# Patient Record
Sex: Male | Born: 1975 | Race: White | Hispanic: No | Marital: Married
Health system: Midwestern US, Academic
[De-identification: ages and names within clinical notes are randomized; demographics above are authoritative.]

---

## 2017-03-29 ENCOUNTER — Encounter: Admit: 2017-03-29 | Discharge: 2017-03-30 | Primary: Family

## 2017-06-06 ENCOUNTER — Encounter: Admit: 2017-06-06 | Discharge: 2017-06-06 | Payer: BC Managed Care – PPO | Primary: Family

## 2017-06-06 ENCOUNTER — Encounter: Admit: 2017-06-06 | Discharge: 2017-06-07 | Payer: BC Managed Care – PPO | Primary: Family

## 2017-06-06 ENCOUNTER — Inpatient Hospital Stay
Admit: 2017-06-06 | Discharge: 2017-06-14 | Disposition: A | Discharge status: Other Acute Inpatient Hospital | Payer: BC Managed Care – PPO

## 2017-06-06 DIAGNOSIS — S11021A Laceration without foreign body of trachea, initial encounter: Principal | ICD-10-CM

## 2017-06-06 DIAGNOSIS — I10 Essential (primary) hypertension: ICD-10-CM

## 2017-06-06 DIAGNOSIS — F329 Major depressive disorder, single episode, unspecified: ICD-10-CM

## 2017-06-06 DIAGNOSIS — C799 Secondary malignant neoplasm of unspecified site: Principal | ICD-10-CM

## 2017-06-06 LAB — LACTIC ACID (BG - RAPID LACTATE): Lab: 1.4 MMOL/L (ref 0.5–2.0)

## 2017-06-06 LAB — ALCOHOL LEVEL: Lab: <10 mg/dL (ref 98–110)

## 2017-06-06 LAB — BASIC METABOLIC PANEL
Lab: 10 % (ref 3–12)
Lab: 136 MMOL/L — ABNORMAL LOW (ref 137–147)
Lab: 21 MMOL/L (ref 21–30)

## 2017-06-06 LAB — CBC: Lab: 10 10*3/uL (ref 4.5–11.0)

## 2017-06-06 LAB — TEG WITH KAOLIN
Lab: 3.1 min (ref <9.1)
Lab: 4.3 min (ref <12.1)
Lab: 64 mm (ref >49.9)

## 2017-06-06 LAB — PROTIME INR (PT): Lab: 1 M/UL (ref 0.8–1.2)

## 2017-06-06 LAB — PTT (APTT): Lab: 22 s — ABNORMAL LOW (ref 24.0–36.5)

## 2017-06-06 LAB — BETA-HCG: Lab: <1 U/L (ref <5)

## 2017-06-06 MED ORDER — FENTANYL CITRATE (PF) 50 MCG/ML IJ SOLN
0 refills | Status: DC
Start: 2017-06-06 — End: 2017-06-06
  Administered 2017-06-06: 19:00:00 100 ug via INTRAVENOUS
  Administered 2017-06-06 (×2): 50 ug via INTRAVENOUS
  Administered 2017-06-06: 18:00:00 100 ug via INTRAVENOUS

## 2017-06-06 MED ORDER — SODIUM CHLORIDE 0.9 % IR SOLN
0 refills | Status: DC
Start: 2017-06-06 — End: 2017-06-07
  Administered 2017-06-06: 18:00:00 1000 mL

## 2017-06-06 MED ORDER — FENTANYL CITRATE (PF) 50 MCG/ML IJ SOLN
25-50 ug | INTRAVENOUS | 0 refills | Status: DC | PRN
Start: 2017-06-06 — End: 2017-06-06

## 2017-06-06 MED ORDER — AMPICILLIN/SULBACTAM 3G/100ML NS IVPB (MB+)
3 g | Freq: Once | INTRAVENOUS | 0 refills | Status: CP
Start: 2017-06-06 — End: ?
  Administered 2017-06-06 (×2): 3 g via INTRAVENOUS

## 2017-06-06 MED ORDER — PROMETHAZINE 25 MG/ML IJ SOLN
6.25 mg | INTRAVENOUS | 0 refills | Status: DC | PRN
Start: 2017-06-06 — End: 2017-06-06

## 2017-06-06 MED ORDER — HALOPERIDOL LACTATE 5 MG/ML IJ SOLN
1 mg | Freq: Once | INTRAVENOUS | 0 refills | Status: DC | PRN
Start: 2017-06-06 — End: 2017-06-06

## 2017-06-06 MED ORDER — PHENYLEPHRINE IN 0.9% NACL(PF) 1 MG/10 ML (100 MCG/ML) IV SYRG
INTRAVENOUS | 0 refills | Status: DC
Start: 2017-06-06 — End: 2017-06-06
  Administered 2017-06-06: 20:00:00 200 ug via INTRAVENOUS

## 2017-06-06 MED ORDER — HYDROMORPHONE (PF) 2 MG/ML IJ SYRG
.5-1 mg | INTRAVENOUS | 0 refills | Status: DC | PRN
Start: 2017-06-06 — End: 2017-06-06

## 2017-06-06 MED ORDER — DEXMEDETOMIDINE# 4MCG/ML IV SOLN
0 refills | Status: DC
Start: 2017-06-06 — End: 2017-06-06
  Administered 2017-06-06 (×2): 10 ug via INTRAVENOUS
  Administered 2017-06-06: 20:00:00 20 ug via INTRAVENOUS
  Administered 2017-06-06: 20:00:00 10 ug via INTRAVENOUS

## 2017-06-06 MED ORDER — SODIUM CHLORIDE 0.9 % IV SOLP
0 refills | Status: DC
Start: 2017-06-06 — End: 2017-06-06
  Administered 2017-06-06: 18:00:00 via INTRAVENOUS

## 2017-06-06 MED ORDER — FENTANYL CITRATE (PF) 50 MCG/ML IJ SOLN
50 ug | Freq: Once | INTRAVENOUS | 0 refills | Status: CP
Start: 2017-06-06 — End: ?

## 2017-06-06 MED ORDER — LACTATED RINGERS IV SOLP
INTRAVENOUS | 0 refills | Status: DC
Start: 2017-06-06 — End: 2017-06-07
  Administered 2017-06-06 – 2017-06-07 (×2): 1000.000 mL via INTRAVENOUS

## 2017-06-06 MED ORDER — PROPOFOL INJ 10 MG/ML IV VIAL
0 refills | Status: DC
Start: 2017-06-06 — End: 2017-06-06
  Administered 2017-06-06: 18:00:00 50 mg via INTRAVENOUS

## 2017-06-06 MED ORDER — KETAMINE 10 MG/ML IJ SOLN
0 refills | Status: DC
Start: 2017-06-06 — End: 2017-06-06
  Administered 2017-06-06: 18:00:00 60 mg via INTRAVENOUS

## 2017-06-06 MED ORDER — ACETAMINOPHEN 1,000 MG/100 ML (10 MG/ML) IV SOLN
1000 mg | Freq: Once | INTRAVENOUS | 0 refills | Status: CP
Start: 2017-06-06 — End: ?
  Administered 2017-06-07: 01:00:00 1000 mg via INTRAVENOUS

## 2017-06-06 MED ORDER — CEFAZOLIN 1 GRAM IJ SOLR
0 refills | Status: DC
Start: 2017-06-06 — End: 2017-06-06
  Administered 2017-06-06: 18:00:00 2 g via INTRAVENOUS

## 2017-06-06 MED ORDER — BACITRACIN ZINC 500 UNIT/GRAM TP OINT
Freq: Two times a day (BID) | TOPICAL | 0 refills | Status: DC
Start: 2017-06-06 — End: 2017-06-14
  Administered 2017-06-07: 03:00:00 via TOPICAL

## 2017-06-06 MED ORDER — ENOXAPARIN 40 MG/0.4 ML SC SYRG
40 mg | Freq: Every day | SUBCUTANEOUS | 0 refills | Status: CP
Start: 2017-06-06 — End: ?
  Administered 2017-06-07 – 2017-06-11 (×5): 40 mg via SUBCUTANEOUS

## 2017-06-06 MED ORDER — LIDOCAINE (PF) 200 MG/10 ML (2 %) IJ SYRG
0 refills | Status: DC
Start: 2017-06-06 — End: 2017-06-06
  Administered 2017-06-06: 20:00:00 80 mg via INTRAVENOUS

## 2017-06-06 MED ORDER — DIPHTH,PERTUS(ACELL),TETANUS 2.5-8-5 LF-MCG-LF/0.5ML IM SUSP
.5 mL | Freq: Once | INTRAMUSCULAR | 0 refills | Status: DC
Start: 2017-06-06 — End: 2017-06-14

## 2017-06-06 MED ORDER — ONDANSETRON HCL (PF) 4 MG/2 ML IJ SOLN
INTRAVENOUS | 0 refills | Status: DC
Start: 2017-06-06 — End: 2017-06-06
  Administered 2017-06-06: 20:00:00 4 mg via INTRAVENOUS

## 2017-06-06 MED ORDER — DIPHENHYDRAMINE HCL 50 MG/ML IJ SOLN
25 mg | Freq: Once | INTRAVENOUS | 0 refills | Status: DC | PRN
Start: 2017-06-06 — End: 2017-06-06

## 2017-06-06 MED ORDER — DEXTRAN 70-HYPROMELLOSE (PF) 0.1-0.3 % OP DPET
0 refills | Status: DC
Start: 2017-06-06 — End: 2017-06-06
  Administered 2017-06-06: 18:00:00 2 [drp] via OPHTHALMIC

## 2017-06-06 MED ORDER — OXYCODONE 5 MG PO TAB
5-10 mg | Freq: Once | ORAL | 0 refills | Status: DC | PRN
Start: 2017-06-06 — End: 2017-06-06

## 2017-06-06 MED ORDER — ELECTROLYTE-A IV SOLP
0 refills | Status: DC
Start: 2017-06-06 — End: 2017-06-06
  Administered 2017-06-06: 19:00:00 via INTRAVENOUS

## 2017-06-06 MED ORDER — LACTATED RINGERS IV SOLP
INTRAVENOUS | 0 refills | Status: DC
Start: 2017-06-06 — End: 2017-06-06

## 2017-06-06 MED ORDER — SUGAMMADEX 100 MG/ML IV SOLN
INTRAVENOUS | 0 refills | Status: DC
Start: 2017-06-06 — End: 2017-06-06
  Administered 2017-06-06: 20:00:00 200 mg via INTRAVENOUS

## 2017-06-06 MED ORDER — FENTANYL CITRATE (PF) 50 MCG/ML IJ SOLN
25-50 ug | INTRAVENOUS | 0 refills | Status: DC | PRN
Start: 2017-06-06 — End: 2017-06-13
  Administered 2017-06-06: 22:00:00 50 ug via INTRAVENOUS
  Administered 2017-06-06: 21:00:00 25 ug via INTRAVENOUS
  Administered 2017-06-07 – 2017-06-08 (×13): 50 ug via INTRAVENOUS

## 2017-06-06 MED ORDER — ROCURONIUM 10 MG/ML IV SOLN
INTRAVENOUS | 0 refills | Status: DC
Start: 2017-06-06 — End: 2017-06-06
  Administered 2017-06-06: 18:00:00 50 mg via INTRAVENOUS
  Administered 2017-06-06: 19:00:00 10 mg via INTRAVENOUS

## 2017-06-06 MED ORDER — CEFAZOLIN INJ 1GM IVP
1 g | INTRAVENOUS | 0 refills | Status: CP
Start: 2017-06-06 — End: ?
  Administered 2017-06-07 (×3): 1 g via INTRAVENOUS

## 2017-06-06 MED ORDER — FLUOXETINE 20 MG PO CAP
60 mg | Freq: Every day | ORAL | 0 refills | Status: DC
Start: 2017-06-06 — End: 2017-06-14
  Administered 2017-06-08 – 2017-06-13 (×6): 60 mg via ORAL

## 2017-06-06 MED ADMIN — FENTANYL CITRATE (PF) 50 MCG/ML IJ SOLN [3037]: 50 ug | INTRAVENOUS | @ 17:00:00 | Stop: 2017-06-06 | NDC 00409909412

## 2017-06-07 ENCOUNTER — Encounter: Admit: 2017-06-07 | Discharge: 2017-06-07 | Payer: BC Managed Care – PPO | Primary: Family

## 2017-06-07 ENCOUNTER — Inpatient Hospital Stay: Admit: 2017-06-07 | Discharge: 2017-06-07 | Payer: BC Managed Care – PPO | Primary: Family

## 2017-06-07 DIAGNOSIS — C799 Secondary malignant neoplasm of unspecified site: Principal | ICD-10-CM

## 2017-06-07 DIAGNOSIS — F329 Major depressive disorder, single episode, unspecified: ICD-10-CM

## 2017-06-07 DIAGNOSIS — I10 Essential (primary) hypertension: ICD-10-CM

## 2017-06-07 DIAGNOSIS — R45851 Suicidal ideations: Principal | ICD-10-CM

## 2017-06-07 LAB — BASIC METABOLIC PANEL: Lab: 139 MMOL/L — ABNORMAL LOW (ref 137–147)

## 2017-06-07 LAB — CBC: Lab: 9.3 10*3/uL — ABNORMAL LOW (ref >60)

## 2017-06-07 MED ORDER — FUROSEMIDE 10 MG/ML IJ SOLN
20 mg | Freq: Once | INTRAVENOUS | 0 refills | Status: CP
Start: 2017-06-07 — End: ?
  Administered 2017-06-07: 15:00:00 20 mg via INTRAVENOUS

## 2017-06-07 MED ORDER — THIAMINE MONONITRATE (VIT B1) 100 MG PO TAB
100 mg | Freq: Every day | ORAL | 0 refills | Status: DC
Start: 2017-06-07 — End: 2017-06-14
  Administered 2017-06-08 – 2017-06-13 (×6): 100 mg via ORAL

## 2017-06-07 MED ORDER — BISACODYL 10 MG RE SUPP
20 mg | Freq: Once | RECTAL | 0 refills | Status: AC
Start: 2017-06-07 — End: ?

## 2017-06-07 MED ORDER — MIRTAZAPINE 15 MG PO TAB
15 mg | Freq: Every evening | ORAL | 0 refills | Status: DC | PRN
Start: 2017-06-07 — End: 2017-06-08

## 2017-06-07 MED ORDER — OXYCODONE 5 MG/5 ML PO SOLN
5 mg | ORAL | 0 refills | Status: DC | PRN
Start: 2017-06-07 — End: 2017-06-09
  Administered 2017-06-07 – 2017-06-09 (×8): 5 mg via ORAL

## 2017-06-07 MED ORDER — FOLIC ACID 1 MG PO TAB
1 mg | Freq: Every day | ORAL | 0 refills | Status: DC
Start: 2017-06-07 — End: 2017-06-14
  Administered 2017-06-08 – 2017-06-13 (×6): 1 mg via ORAL

## 2017-06-07 MED ORDER — CEFAZOLIN INJ 1GM IVP
2 g | INTRAVENOUS | 0 refills | Status: DC
Start: 2017-06-07 — End: 2017-06-13
  Administered 2017-06-08 – 2017-06-13 (×17): 2 g via INTRAVENOUS

## 2017-06-07 MED ORDER — ACETAMINOPHEN 1,000 MG/100 ML (10 MG/ML) IV SOLN
1000 mg | Freq: Once | INTRAVENOUS | 0 refills | Status: CP
Start: 2017-06-07 — End: ?
  Administered 2017-06-07: 15:00:00 1000 mg via INTRAVENOUS

## 2017-06-07 MED ORDER — ACETAMINOPHEN 160 MG/5 ML PO SOLN
1000 mg | ORAL | 0 refills | Status: DC | PRN
Start: 2017-06-07 — End: 2017-06-09
  Administered 2017-06-08 (×3): 1000 mg via ORAL

## 2017-06-07 MED ORDER — PANTOPRAZOLE 40 MG IV SOLR
40 mg | Freq: Every day | INTRAVENOUS | 0 refills | Status: DC
Start: 2017-06-07 — End: 2017-06-09
  Administered 2017-06-07 – 2017-06-09 (×3): 40 mg via INTRAVENOUS

## 2017-06-07 MED ORDER — ACETAMINOPHEN 1,000 MG/100 ML (10 MG/ML) IV SOLN
1000 mg | Freq: Once | INTRAVENOUS | 0 refills | Status: CP
Start: 2017-06-07 — End: ?
  Administered 2017-06-07: 09:00:00 1000 mg via INTRAVENOUS

## 2017-06-07 MED ORDER — POLYETHYLENE GLYCOL 3350 17 GRAM PO PWPK
1 | Freq: Two times a day (BID) | ORAL | 0 refills | Status: DC
Start: 2017-06-07 — End: 2017-06-14
  Administered 2017-06-08 – 2017-06-09 (×2): 17 g via ORAL

## 2017-06-08 ENCOUNTER — Encounter: Admit: 2017-06-08 | Discharge: 2017-06-08 | Payer: BC Managed Care – PPO | Primary: Family

## 2017-06-08 ENCOUNTER — Inpatient Hospital Stay: Admit: 2017-06-08 | Discharge: 2017-06-08 | Payer: BC Managed Care – PPO | Primary: Family

## 2017-06-08 DIAGNOSIS — S11021A Laceration without foreign body of trachea, initial encounter: Principal | ICD-10-CM

## 2017-06-08 DIAGNOSIS — I10 Essential (primary) hypertension: ICD-10-CM

## 2017-06-08 DIAGNOSIS — F329 Major depressive disorder, single episode, unspecified: ICD-10-CM

## 2017-06-08 DIAGNOSIS — C799 Secondary malignant neoplasm of unspecified site: Principal | ICD-10-CM

## 2017-06-08 LAB — BASIC METABOLIC PANEL
Lab: 128 mg/dL — ABNORMAL HIGH (ref >60)
Lab: 136 MMOL/L — ABNORMAL LOW (ref 137–147)
Lab: 29 MMOL/L (ref 21–30)
Lab: 4 MMOL/L — ABNORMAL LOW (ref >60)
Lab: 7 pg (ref 3–12)
Lab: 9 mg/dL — ABNORMAL LOW (ref >60)
Lab: 9.5 mg/dL (ref 8.5–10.6)
Lab: >60 mL/min (ref >60)
Lab: >60 mL/min (ref >60)

## 2017-06-08 LAB — CBC: Lab: 7.4 K/UL — ABNORMAL LOW (ref 4.5–11.0)

## 2017-06-08 MED ORDER — MELATONIN 3 MG PO TAB
3 mg | Freq: Every evening | ORAL | 0 refills | Status: DC
Start: 2017-06-08 — End: 2017-06-14
  Administered 2017-06-09 – 2017-06-14 (×6): 3 mg via ORAL

## 2017-06-08 MED ORDER — LACTATED RINGERS IV SOLP
INTRAVENOUS | 0 refills | Status: AC
Start: 2017-06-08 — End: ?
  Administered 2017-06-08 – 2017-06-10 (×3): 1000.000 mL via INTRAVENOUS

## 2017-06-08 MED ORDER — LACTATED RINGERS IV SOLP
INTRAVENOUS | 0 refills | Status: DC
Start: 2017-06-08 — End: 2017-06-08

## 2017-06-09 ENCOUNTER — Inpatient Hospital Stay: Admit: 2017-06-09 | Discharge: 2017-06-09 | Payer: BC Managed Care – PPO | Primary: Family

## 2017-06-09 LAB — CBC: Lab: 5.6 K/UL — ABNORMAL HIGH (ref 4.5–11.0)

## 2017-06-09 LAB — BASIC METABOLIC PANEL: Lab: 139 MMOL/L — ABNORMAL LOW (ref 137–147)

## 2017-06-09 MED ORDER — OXYCODONE 5 MG PO TAB
5 mg | ORAL | 0 refills | Status: DC | PRN
Start: 2017-06-09 — End: 2017-06-13
  Administered 2017-06-10 – 2017-06-11 (×3): 5 mg via ORAL

## 2017-06-09 MED ORDER — TRAZODONE 50 MG PO TAB
50 mg | Freq: Every evening | ORAL | 0 refills | Status: DC | PRN
Start: 2017-06-09 — End: 2017-06-14

## 2017-06-09 MED ORDER — ACETAMINOPHEN 500 MG PO TAB
1000 mg | ORAL | 0 refills | Status: DC | PRN
Start: 2017-06-09 — End: 2017-06-14
  Administered 2017-06-09 – 2017-06-14 (×9): 1000 mg via ORAL

## 2017-06-09 MED ORDER — PANTOPRAZOLE 40 MG PO TBEC
40 mg | Freq: Every day | ORAL | 0 refills | Status: DC
Start: 2017-06-09 — End: 2017-06-14
  Administered 2017-06-10 – 2017-06-13 (×4): 40 mg via ORAL

## 2017-06-10 ENCOUNTER — Emergency Department: Admit: 2017-06-06 | Discharge: 2017-06-06 | Payer: BC Managed Care – PPO

## 2017-06-10 LAB — CBC: Lab: 4.3 K/UL — ABNORMAL LOW (ref 4.5–11.0)

## 2017-06-10 LAB — BASIC METABOLIC PANEL
Lab: 133 MMOL/L — ABNORMAL LOW (ref 137–147)
Lab: 3.6 MMOL/L — ABNORMAL LOW (ref 3.5–5.1)

## 2017-06-10 MED ORDER — POTASSIUM CHLORIDE 20 MEQ PO TBTQ
40 meq | Freq: Once | ORAL | 0 refills | Status: CP
Start: 2017-06-10 — End: ?
  Administered 2017-06-10: 13:00:00 40 meq via ORAL

## 2017-06-11 ENCOUNTER — Inpatient Hospital Stay: Admit: 2017-06-11 | Discharge: 2017-06-11 | Payer: BC Managed Care – PPO | Primary: Family

## 2017-06-11 LAB — BASIC METABOLIC PANEL: Lab: 139 MMOL/L — ABNORMAL HIGH (ref >60)

## 2017-06-11 LAB — CBC
Lab: 4.3 M/UL — ABNORMAL LOW (ref >60)
Lab: 4.7 K/UL — ABNORMAL HIGH (ref >60)

## 2017-06-11 MED ORDER — LACTATED RINGERS IV SOLP
INTRAVENOUS | 0 refills | Status: DC
Start: 2017-06-11 — End: 2017-06-13
  Administered 2017-06-12 – 2017-06-13 (×2): 1000.000 mL via INTRAVENOUS

## 2017-06-12 ENCOUNTER — Encounter: Admit: 2017-06-12 | Discharge: 2017-06-12 | Payer: BC Managed Care – PPO | Primary: Family

## 2017-06-12 ENCOUNTER — Inpatient Hospital Stay: Admit: 2017-06-12 | Discharge: 2017-06-12 | Payer: BC Managed Care – PPO | Primary: Family

## 2017-06-12 DIAGNOSIS — Z87828 Personal history of other (healed) physical injury and trauma: ICD-10-CM

## 2017-06-12 DIAGNOSIS — C799 Secondary malignant neoplasm of unspecified site: Principal | ICD-10-CM

## 2017-06-12 DIAGNOSIS — I10 Essential (primary) hypertension: ICD-10-CM

## 2017-06-12 DIAGNOSIS — F329 Major depressive disorder, single episode, unspecified: ICD-10-CM

## 2017-06-12 DIAGNOSIS — S11021A Laceration without foreign body of trachea, initial encounter: Principal | ICD-10-CM

## 2017-06-12 LAB — CBC
Lab: 4.3 M/UL — ABNORMAL LOW (ref 4.4–5.5)
Lab: 5.9 10*3/uL — ABNORMAL HIGH (ref >60)

## 2017-06-12 LAB — BASIC METABOLIC PANEL: Lab: 137 MMOL/L — ABNORMAL HIGH (ref >60)

## 2017-06-12 MED ORDER — SODIUM CHLORIDE 0.9 % IV SOLP
INTRAVENOUS | 0 refills | Status: DC
Start: 2017-06-12 — End: 2017-06-13
  Administered 2017-06-12: 13:00:00 1000.000 mL via INTRAVENOUS

## 2017-06-12 MED ORDER — FENTANYL CITRATE (PF) 50 MCG/ML IJ SOLN
25 ug | INTRAVENOUS | 0 refills | Status: DC | PRN
Start: 2017-06-12 — End: 2017-06-12

## 2017-06-12 MED ORDER — DEXAMETHASONE SODIUM PHOSPHATE 4 MG/ML IJ SOLN
INTRAVENOUS | 0 refills | Status: DC
Start: 2017-06-12 — End: 2017-06-12
  Administered 2017-06-12: 14:00:00 4 mg via INTRAVENOUS

## 2017-06-12 MED ORDER — FENTANYL CITRATE (PF) 50 MCG/ML IJ SOLN
50 ug | INTRAVENOUS | 0 refills | Status: DC | PRN
Start: 2017-06-12 — End: 2017-06-12

## 2017-06-12 MED ORDER — LIDOCAINE HCL 10 MG/ML (1 %) IJ SOLN
0 refills | Status: DC
Start: 2017-06-12 — End: 2017-06-12
  Administered 2017-06-12: 14:00:00 4 mL via INTRAMUSCULAR

## 2017-06-12 MED ORDER — SODIUM CHLORIDE 0.9 % IR SOLN
0 refills | Status: DC
Start: 2017-06-12 — End: 2017-06-12
  Administered 2017-06-12: 14:00:00 500 mL

## 2017-06-12 MED ORDER — METOCLOPRAMIDE HCL 5 MG/ML IJ SOLN
10 mg | Freq: Once | INTRAVENOUS | 0 refills | Status: DC | PRN
Start: 2017-06-12 — End: 2017-06-12

## 2017-06-12 MED ORDER — PROPOFOL INJ 10 MG/ML IV VIAL
0 refills | Status: DC
Start: 2017-06-12 — End: 2017-06-12
  Administered 2017-06-12: 13:00:00 150 mg via INTRAVENOUS
  Administered 2017-06-12: 14:00:00 50 mg via INTRAVENOUS

## 2017-06-12 MED ORDER — HALOPERIDOL LACTATE 5 MG/ML IJ SOLN
1 mg | Freq: Once | INTRAVENOUS | 0 refills | Status: DC | PRN
Start: 2017-06-12 — End: 2017-06-12

## 2017-06-12 MED ORDER — PROMETHAZINE 25 MG/ML IJ SOLN
6.25 mg | INTRAVENOUS | 0 refills | Status: DC | PRN
Start: 2017-06-12 — End: 2017-06-12

## 2017-06-12 MED ORDER — LIDOCAINE (PF) 10 MG/ML (1 %) IJ SOLN
.1-2 mL | INTRAMUSCULAR | 0 refills | Status: DC | PRN
Start: 2017-06-12 — End: 2017-06-12

## 2017-06-12 MED ORDER — ONDANSETRON HCL (PF) 4 MG/2 ML IJ SOLN
INTRAVENOUS | 0 refills | Status: DC
Start: 2017-06-12 — End: 2017-06-12
  Administered 2017-06-12: 14:00:00 4 mg via INTRAVENOUS

## 2017-06-12 MED ORDER — OXYCODONE 5 MG PO TAB
5 mg | ORAL_TABLET | ORAL | 0 refills | Status: CN | PRN
Start: 2017-06-12 — End: ?

## 2017-06-12 MED ADMIN — SODIUM CHLORIDE 0.9 % IV SOLP [27838]: INTRAVENOUS | @ 13:00:00 | Stop: 2017-06-12 | NDC 00338004904

## 2017-06-12 MED ADMIN — CEFAZOLIN 1 GRAM IJ SOLR [1445]: 2 g | INTRAVENOUS | @ 13:00:00 | Stop: 2017-06-12 | NDC 00143992490

## 2017-06-13 ENCOUNTER — Encounter: Admit: 2017-06-13 | Discharge: 2017-06-13 | Payer: BC Managed Care – PPO | Primary: Family

## 2017-06-13 DIAGNOSIS — S2753XA Laceration of thoracic trachea, initial encounter: ICD-10-CM

## 2017-06-13 DIAGNOSIS — S11021A Laceration without foreign body of trachea, initial encounter: ICD-10-CM

## 2017-06-13 LAB — CBC: Lab: 6.9 K/UL — ABNORMAL LOW (ref 4.5–11.0)

## 2017-06-13 LAB — BASIC METABOLIC PANEL: Lab: 139 MMOL/L — ABNORMAL LOW (ref 137–147)

## 2017-06-13 MED ORDER — POLYETHYLENE GLYCOL 3350 17 GRAM PO PWPK
1 | Freq: Two times a day (BID) | ORAL | 0 refills | Status: DC
Start: 2017-06-13 — End: 2017-06-21

## 2017-06-13 MED ORDER — POLYETHYLENE GLYCOL 3350 17 GRAM PO PWPK
17 g | Freq: Two times a day (BID) | ORAL | 0 refills | 18.00000 days | Status: DC
Start: 2017-06-13 — End: 2017-06-21

## 2017-06-13 MED ORDER — THIAMINE MONONITRATE (VIT B1) 100 MG PO TAB
100 mg | Freq: Every day | ORAL | 0 refills | Status: DC
Start: 2017-06-13 — End: 2017-06-22
  Administered 2017-06-14 – 2017-06-21 (×8): 100 mg via ORAL

## 2017-06-13 MED ORDER — FLUOXETINE 20 MG PO CAP
60 mg | ORAL_CAPSULE | Freq: Every day | ORAL | 0 refills | Status: SS
Start: 2017-06-13 — End: 2017-06-21

## 2017-06-13 MED ORDER — DIPHENHYDRAMINE HCL 50 MG/ML IJ SOLN
50 mg | INTRAMUSCULAR | 0 refills | Status: DC | PRN
Start: 2017-06-13 — End: 2017-06-22
  Administered 2017-06-16: 01:00:00 50 mg via INTRAMUSCULAR

## 2017-06-13 MED ORDER — ACETAMINOPHEN 500 MG PO TAB
1000 mg | ORAL | 0 refills | Status: AC | PRN
Start: 2017-06-13 — End: ?

## 2017-06-13 MED ORDER — HYDROXYZINE HCL 25 MG PO TAB
25 mg | Freq: Three times a day (TID) | ORAL | 0 refills | Status: DC | PRN
Start: 2017-06-13 — End: 2017-06-22
  Administered 2017-06-19 (×2): 25 mg via ORAL

## 2017-06-13 MED ORDER — MELATONIN 3 MG PO TAB
3 mg | Freq: Every evening | ORAL | 0 refills | 28.00000 days | Status: AC
Start: 2017-06-13 — End: ?

## 2017-06-13 MED ORDER — TRAZODONE 50 MG PO TAB
50 mg | ORAL_TABLET | Freq: Every evening | ORAL | 0 refills | Status: SS | PRN
Start: 2017-06-13 — End: 2017-06-21

## 2017-06-13 MED ORDER — FOLIC ACID 1 MG PO TAB
1 mg | Freq: Every day | ORAL | 0 refills | Status: DC
Start: 2017-06-13 — End: 2017-06-22
  Administered 2017-06-14 – 2017-06-21 (×8): 1 mg via ORAL

## 2017-06-13 MED ORDER — PANTOPRAZOLE 40 MG PO TBEC
40 mg | ORAL_TABLET | Freq: Every day | ORAL | 3 refills | 90.00000 days | Status: AC
Start: 2017-06-13 — End: ?
  Filled 2017-06-13: qty 90, 30d supply

## 2017-06-13 MED ORDER — TRAZODONE 50 MG PO TAB
50 mg | Freq: Every evening | ORAL | 0 refills | Status: DC | PRN
Start: 2017-06-13 — End: 2017-06-21

## 2017-06-13 MED ORDER — BACITRACIN ZINC 500 UNIT/GRAM TP OINT
Freq: Two times a day (BID) | TOPICAL | 0 refills | Status: DC
Start: 2017-06-13 — End: 2017-06-22
  Administered 2017-06-14: 15:00:00 via TOPICAL

## 2017-06-13 MED ORDER — ACETAMINOPHEN 500 MG PO TAB
1000 mg | ORAL | 0 refills | Status: DC | PRN
Start: 2017-06-13 — End: 2017-06-22
  Administered 2017-06-14 – 2017-06-19 (×5): 1000 mg via ORAL

## 2017-06-13 MED ORDER — MELATONIN 3 MG PO TAB
3 mg | Freq: Every evening | ORAL | 0 refills | Status: DC
Start: 2017-06-13 — End: 2017-06-22
  Administered 2017-06-15 – 2017-06-21 (×7): 3 mg via ORAL

## 2017-06-13 MED ORDER — PANTOPRAZOLE 40 MG PO TBEC
40 mg | Freq: Every day | ORAL | 0 refills | Status: DC
Start: 2017-06-13 — End: 2017-06-22
  Administered 2017-06-14 – 2017-06-21 (×8): 40 mg via ORAL

## 2017-06-13 MED ORDER — OLANZAPINE 5 MG PO TBDI
5 mg | ORAL | 0 refills | Status: DC | PRN
Start: 2017-06-13 — End: 2017-06-22

## 2017-06-13 MED ORDER — FLUOXETINE 20 MG PO CAP
60 mg | Freq: Every day | ORAL | 0 refills | Status: DC
Start: 2017-06-13 — End: 2017-06-20
  Administered 2017-06-14 – 2017-06-20 (×7): 60 mg via ORAL

## 2017-06-13 MED ORDER — FOLIC ACID 1 MG PO TAB
1 mg | ORAL_TABLET | Freq: Every day | ORAL | 0 refills | Status: SS
Start: 2017-06-13 — End: 2017-06-21

## 2017-06-13 MED ORDER — THIAMINE MONONITRATE (VIT B1) 100 MG PO TAB
100 mg | Freq: Every day | ORAL | 0 refills | Status: SS
Start: 2017-06-13 — End: 2017-06-21

## 2017-06-13 MED ORDER — OLANZAPINE 10 MG IM SOLR
5 mg | INTRAMUSCULAR | 0 refills | Status: DC | PRN
Start: 2017-06-13 — End: 2017-06-22

## 2017-06-13 MED ORDER — BACITRACIN ZINC 500 UNIT/GRAM TP OINT
Freq: Two times a day (BID) | 0 refills | 14.00000 days | Status: AC
Start: 2017-06-13 — End: ?

## 2017-06-13 MED FILL — FLUOXETINE 20 MG PO CAP: 20 mg | ORAL | 30 days supply | Qty: 90 | Status: CN

## 2017-06-13 MED FILL — TRAZODONE 50 MG PO TAB: 50 mg | ORAL | 30 days supply | Qty: 30 | Status: CN

## 2017-06-14 ENCOUNTER — Encounter: Admit: 2017-06-14 | Discharge: 2017-06-21 | Payer: BC Managed Care – PPO | Primary: Family

## 2017-06-14 ENCOUNTER — Inpatient Hospital Stay: Admit: 2017-06-13 | Discharge: 2017-06-13 | Payer: BC Managed Care – PPO | Primary: Family

## 2017-06-14 ENCOUNTER — Inpatient Hospital Stay: Admit: 2017-06-12 | Discharge: 2017-06-12 | Payer: BC Managed Care – PPO | Primary: Family

## 2017-06-14 ENCOUNTER — Inpatient Hospital Stay: Admit: 2017-06-09 | Discharge: 2017-06-09 | Payer: BC Managed Care – PPO | Primary: Family

## 2017-06-14 ENCOUNTER — Encounter: Admit: 2017-06-14 | Discharge: 2017-06-14 | Payer: BC Managed Care – PPO | Primary: Family

## 2017-06-14 ENCOUNTER — Emergency Department: Admit: 2017-06-06 | Discharge: 2017-06-06 | Payer: BC Managed Care – PPO

## 2017-06-14 ENCOUNTER — Inpatient Hospital Stay: Admit: 2017-06-10 | Discharge: 2017-06-10 | Payer: BC Managed Care – PPO | Primary: Family

## 2017-06-14 DIAGNOSIS — I10 Essential (primary) hypertension: ICD-10-CM

## 2017-06-14 DIAGNOSIS — S61511A Laceration without foreign body of right wrist, initial encounter: ICD-10-CM

## 2017-06-14 DIAGNOSIS — C799 Secondary malignant neoplasm of unspecified site: ICD-10-CM

## 2017-06-14 DIAGNOSIS — S022XXA Fracture of nasal bones, initial encounter for closed fracture: ICD-10-CM

## 2017-06-14 DIAGNOSIS — S61512A Laceration without foreign body of left wrist, initial encounter: ICD-10-CM

## 2017-06-14 DIAGNOSIS — F411 Generalized anxiety disorder: ICD-10-CM

## 2017-06-14 DIAGNOSIS — T797XXA Traumatic subcutaneous emphysema, initial encounter: ICD-10-CM

## 2017-06-14 DIAGNOSIS — F333 Major depressive disorder, recurrent, severe with psychotic symptoms: ICD-10-CM

## 2017-06-14 DIAGNOSIS — F1020 Alcohol dependence, uncomplicated: ICD-10-CM

## 2017-06-14 DIAGNOSIS — Z915 Personal history of self-harm: ICD-10-CM

## 2017-06-14 DIAGNOSIS — C439 Malignant melanoma of skin, unspecified: ICD-10-CM

## 2017-06-14 DIAGNOSIS — S11021A Laceration without foreign body of trachea, initial encounter: Principal | ICD-10-CM

## 2017-06-14 DIAGNOSIS — F329 Major depressive disorder, single episode, unspecified: ICD-10-CM

## 2017-06-14 DIAGNOSIS — Z87828 Personal history of other (healed) physical injury and trauma: ICD-10-CM

## 2017-06-14 MED ORDER — DIPHENHYDRAMINE HCL 25 MG PO CAP
50 mg | Freq: Once | ORAL | 0 refills | Status: CP
Start: 2017-06-14 — End: ?
  Administered 2017-06-15: 50 mg via ORAL

## 2017-06-15 MED ORDER — HYDROCORTISONE 2.5 % TP CREA
Freq: Two times a day (BID) | TOPICAL | 0 refills | Status: DC
Start: 2017-06-15 — End: 2017-06-20
  Administered 2017-06-15: 18:00:00 via TOPICAL
  Administered 2017-06-16: 03:00:00 1 g via TOPICAL

## 2017-06-16 MED ORDER — GABAPENTIN 300 MG PO CAP
300 mg | Freq: Three times a day (TID) | ORAL | 0 refills | Status: DC
Start: 2017-06-16 — End: 2017-06-20
  Administered 2017-06-16 – 2017-06-20 (×11): 300 mg via ORAL

## 2017-06-17 LAB — COMPREHENSIVE METABOLIC PANEL
Lab: 0.3 mg/dL (ref 0.3–1.2)
Lab: 1.1 mg/dL (ref 0.4–1.24)
Lab: 103 MMOL/L (ref 98–110)
Lab: 125 mg/dL — ABNORMAL HIGH (ref 70–100)
Lab: 134 MMOL/L — ABNORMAL LOW (ref 137–147)
Lab: 17 mg/dL (ref 7–25)
Lab: 23 MMOL/L — ABNORMAL HIGH (ref 21–30)
Lab: 31 U/L (ref 7–40)
Lab: 38 U/L (ref 7–56)
Lab: 4 MMOL/L (ref 3.5–5.1)
Lab: 4.3 g/dL (ref 3.5–5.0)
Lab: 63 U/L (ref 25–110)
Lab: 7.6 g/dL (ref 6.0–8.0)
Lab: 8 10*3/uL (ref 3–12)
Lab: 9.5 mg/dL (ref 8.5–10.6)
Lab: >60 mL/min (ref >60)
Lab: >60 mL/min (ref >60)

## 2017-06-17 LAB — CBC AND DIFF
Lab: 0 10*3/uL (ref 0–0.20)
Lab: 0.7 10*3/uL — ABNORMAL HIGH (ref 0–0.45)
Lab: 7.1 10*3/uL (ref 4.5–11.0)

## 2017-06-19 MED ORDER — NALTREXONE 50 MG PO TAB
50 mg | Freq: Every evening | ORAL | 0 refills | Status: DC
Start: 2017-06-19 — End: 2017-06-22
  Administered 2017-06-20 – 2017-06-21 (×2): 50 mg via ORAL

## 2017-06-20 ENCOUNTER — Encounter: Admit: 2017-06-20 | Discharge: 2017-06-20 | Payer: BC Managed Care – PPO | Primary: Family

## 2017-06-20 MED ORDER — IOHEXOL 350 MG IODINE/ML IV SOLN
60 mL | Freq: Once | INTRAVENOUS | 0 refills | Status: CP
Start: 2017-06-20 — End: ?

## 2017-06-20 MED ORDER — SODIUM CHLORIDE 0.9 % IJ SOLN
50 mL | Freq: Once | INTRAVENOUS | 0 refills | Status: CP
Start: 2017-06-20 — End: ?
  Administered 2017-06-20: 23:00:00 50 mL via INTRAVENOUS

## 2017-06-20 MED ORDER — FLUOXETINE 20 MG PO CAP
60 mg | Freq: Every evening | ORAL | 0 refills | Status: DC
Start: 2017-06-20 — End: 2017-06-21

## 2017-06-20 MED ORDER — GABAPENTIN 100 MG PO CAP
200 mg | Freq: Three times a day (TID) | ORAL | 0 refills | Status: DC
Start: 2017-06-20 — End: 2017-06-22
  Administered 2017-06-20 – 2017-06-21 (×4): 200 mg via ORAL

## 2017-06-20 MED ORDER — HYDROCORTISONE 2.5 % TP CREA
Freq: Two times a day (BID) | TOPICAL | 0 refills | Status: DC | PRN
Start: 2017-06-20 — End: 2017-06-22

## 2017-06-21 ENCOUNTER — Encounter: Admit: 2017-06-21 | Discharge: 2017-06-21 | Payer: BC Managed Care – PPO | Primary: Family

## 2017-06-21 ENCOUNTER — Encounter: Admit: 2017-06-20 | Discharge: 2017-06-20 | Payer: BC Managed Care – PPO | Primary: Family

## 2017-06-21 DIAGNOSIS — C439 Malignant melanoma of skin, unspecified: ICD-10-CM

## 2017-06-21 DIAGNOSIS — Z818 Family history of other mental and behavioral disorders: ICD-10-CM

## 2017-06-21 DIAGNOSIS — F4325 Adjustment disorder with mixed disturbance of emotions and conduct: ICD-10-CM

## 2017-06-21 DIAGNOSIS — I10 Essential (primary) hypertension: ICD-10-CM

## 2017-06-21 DIAGNOSIS — F1020 Alcohol dependence, uncomplicated: ICD-10-CM

## 2017-06-21 DIAGNOSIS — Z79899 Other long term (current) drug therapy: ICD-10-CM

## 2017-06-21 DIAGNOSIS — F411 Generalized anxiety disorder: ICD-10-CM

## 2017-06-21 DIAGNOSIS — D721 Eosinophilia: ICD-10-CM

## 2017-06-21 DIAGNOSIS — F332 Major depressive disorder, recurrent severe without psychotic features: Principal | ICD-10-CM

## 2017-06-21 DIAGNOSIS — F410 Panic disorder [episodic paroxysmal anxiety] without agoraphobia: ICD-10-CM

## 2017-06-21 MED ORDER — GABAPENTIN 100 MG PO CAP
200 mg | ORAL_CAPSULE | Freq: Three times a day (TID) | ORAL | 0 refills | Status: AC
Start: 2017-06-21 — End: ?
  Filled 2017-06-21: qty 180, 15d supply

## 2017-06-21 MED ORDER — NALTREXONE 50 MG PO TAB
50 mg | ORAL_TABLET | Freq: Every evening | ORAL | 0 refills | Status: AC
Start: 2017-06-21 — End: ?
  Filled 2017-06-21: qty 30, 30d supply

## 2017-06-21 MED ORDER — THIAMINE MONONITRATE (VIT B1) 100 MG PO TAB
100 mg | ORAL_TABLET | Freq: Every day | ORAL | 0 refills | 10.00000 days | Status: AC
Start: 2017-06-21 — End: ?
  Filled 2017-06-21: qty 30, 30d supply

## 2017-06-21 MED ORDER — FLUOXETINE 20 MG PO CAP
60 mg | Freq: Every day | ORAL | 0 refills | Status: DC
Start: 2017-06-21 — End: 2017-06-22
  Administered 2017-06-21: 19:00:00 60 mg via ORAL

## 2017-06-21 MED ORDER — FOLIC ACID 1 MG PO TAB
1 mg | ORAL_TABLET | Freq: Every day | ORAL | 0 refills | Status: AC
Start: 2017-06-21 — End: ?
  Filled 2017-06-21: qty 30, 30d supply

## 2017-06-21 MED ORDER — FLUOXETINE 20 MG PO CAP
60 mg | ORAL_CAPSULE | Freq: Every day | ORAL | 0 refills | Status: AC
Start: 2017-06-21 — End: ?
  Filled 2017-06-21: qty 90, 30d supply

## 2017-06-29 ENCOUNTER — Encounter: Admit: 2017-06-29 | Discharge: 2017-06-29 | Payer: BC Managed Care – PPO | Primary: Family

## 2017-07-01 ENCOUNTER — Encounter: Admit: 2017-07-01 | Discharge: 2017-07-01 | Payer: BC Managed Care – PPO | Primary: Family

## 2017-07-03 ENCOUNTER — Encounter: Admit: 2017-07-03 | Discharge: 2017-07-03 | Payer: BC Managed Care – PPO | Primary: Family

## 2017-07-03 DIAGNOSIS — S11021A Laceration without foreign body of trachea, initial encounter: Principal | ICD-10-CM

## 2017-07-04 ENCOUNTER — Encounter: Admit: 2017-07-04 | Discharge: 2017-07-04 | Payer: BC Managed Care – PPO | Primary: Family

## 2017-07-04 DIAGNOSIS — Z85820 Personal history of malignant melanoma of skin: ICD-10-CM

## 2017-07-04 DIAGNOSIS — F329 Major depressive disorder, single episode, unspecified: ICD-10-CM

## 2017-07-04 DIAGNOSIS — Z87828 Personal history of other (healed) physical injury and trauma: ICD-10-CM

## 2017-07-04 DIAGNOSIS — S11021A Laceration without foreign body of trachea, initial encounter: Principal | ICD-10-CM

## 2017-07-04 DIAGNOSIS — I10 Essential (primary) hypertension: ICD-10-CM

## 2017-07-04 DIAGNOSIS — C799 Secondary malignant neoplasm of unspecified site: Principal | ICD-10-CM

## 2017-07-04 MED ORDER — ONDANSETRON HCL (PF) 4 MG/2 ML IJ SOLN
INTRAVENOUS | 0 refills | Status: DC
Start: 2017-07-04 — End: 2017-07-04
  Administered 2017-07-04: 16:00:00 4 mg via INTRAVENOUS

## 2017-07-04 MED ORDER — SODIUM CHLORIDE 0.9 % IR SOLN
0 refills | Status: DC
Start: 2017-07-04 — End: 2017-07-04
  Administered 2017-07-04: 16:00:00 1000 mL

## 2017-07-04 MED ORDER — LIDOCAINE HCL 10 MG/ML (1 %) IJ SOLN
0 refills | Status: DC
Start: 2017-07-04 — End: 2017-07-04
  Administered 2017-07-04: 16:00:00 2 mL via INTRAMUSCULAR

## 2017-07-04 MED ORDER — FENTANYL CITRATE (PF) 50 MCG/ML IJ SOLN
50 ug | INTRAVENOUS | 0 refills | Status: DC | PRN
Start: 2017-07-04 — End: 2017-07-04

## 2017-07-04 MED ORDER — HALOPERIDOL LACTATE 5 MG/ML IJ SOLN
1 mg | Freq: Once | INTRAVENOUS | 0 refills | Status: DC | PRN
Start: 2017-07-04 — End: 2017-07-04

## 2017-07-04 MED ORDER — ACETAMINOPHEN 500 MG PO TAB
1000 mg | Freq: Once | ORAL | 0 refills | Status: CP | PRN
Start: 2017-07-04 — End: ?

## 2017-07-04 MED ORDER — PROPOFOL INJ 10 MG/ML IV VIAL
0 refills | Status: DC
Start: 2017-07-04 — End: 2017-07-04
  Administered 2017-07-04: 15:00:00 50 mg via INTRAVENOUS
  Administered 2017-07-04: 15:00:00 150 mg via INTRAVENOUS
  Administered 2017-07-04: 15:00:00 100 mg via INTRAVENOUS
  Administered 2017-07-04: 15:00:00 50 mg via INTRAVENOUS

## 2017-07-04 MED ORDER — LIDOCAINE (PF) 10 MG/ML (1 %) IJ SOLN
.1-2 mL | INTRAMUSCULAR | 0 refills | Status: DC | PRN
Start: 2017-07-04 — End: 2017-07-04

## 2017-07-04 MED ORDER — DEXAMETHASONE SODIUM PHOSPHATE 4 MG/ML IJ SOLN
INTRAVENOUS | 0 refills | Status: DC
Start: 2017-07-04 — End: 2017-07-04
  Administered 2017-07-04: 16:00:00 4 mg via INTRAVENOUS

## 2017-07-04 MED ORDER — LIDOCAINE (PF) 200 MG/10 ML (2 %) IJ SYRG
0 refills | Status: DC
Start: 2017-07-04 — End: 2017-07-04
  Administered 2017-07-04: 15:00:00 60 mg via INTRAVENOUS

## 2017-07-04 MED ORDER — SODIUM CHLORIDE 0.9 % IV SOLP
INTRAVENOUS | 0 refills | Status: DC
Start: 2017-07-04 — End: 2017-07-04

## 2017-07-04 MED ADMIN — FENTANYL CITRATE (PF) 50 MCG/ML IJ SOLN [3037]: 50 ug | INTRAVENOUS | @ 16:00:00 | Stop: 2017-07-04 | NDC 00409909412

## 2017-07-04 MED ADMIN — ACETAMINOPHEN 500 MG PO TAB [102]: 1000 mg | ORAL | @ 16:00:00 | Stop: 2017-07-04 | NDC 00904673061

## 2017-07-04 MED ADMIN — SODIUM CHLORIDE 0.9 % IV SOLP [27838]: INTRAVENOUS | @ 13:00:00 | Stop: 2017-07-04 | NDC 00338004904

## 2017-07-06 ENCOUNTER — Encounter: Admit: 2017-07-06 | Discharge: 2017-07-06 | Payer: BC Managed Care – PPO | Primary: Family

## 2017-07-06 DIAGNOSIS — F329 Major depressive disorder, single episode, unspecified: ICD-10-CM

## 2017-07-06 DIAGNOSIS — Z87828 Personal history of other (healed) physical injury and trauma: ICD-10-CM

## 2017-07-06 DIAGNOSIS — C799 Secondary malignant neoplasm of unspecified site: Principal | ICD-10-CM

## 2017-07-06 DIAGNOSIS — I10 Essential (primary) hypertension: ICD-10-CM

## 2017-08-31 ENCOUNTER — Encounter: Admit: 2017-08-31 | Discharge: 2017-08-31 | Payer: BC Managed Care – PPO | Primary: Family

## 2017-08-31 DIAGNOSIS — I10 Essential (primary) hypertension: ICD-10-CM

## 2017-08-31 DIAGNOSIS — C799 Secondary malignant neoplasm of unspecified site: Principal | ICD-10-CM

## 2017-08-31 DIAGNOSIS — Z87828 Personal history of other (healed) physical injury and trauma: ICD-10-CM

## 2017-08-31 DIAGNOSIS — F329 Major depressive disorder, single episode, unspecified: ICD-10-CM
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
## 2018-06-04 ENCOUNTER — Encounter: Admit: 2018-06-04 | Discharge: 2018-06-04 | Payer: BC Managed Care – PPO | Primary: Family

## 2018-07-26 ENCOUNTER — Encounter: Admit: 2018-07-26 | Discharge: 2018-07-26 | Payer: BC Managed Care – PPO | Primary: Family

## 2018-12-28 ENCOUNTER — Encounter: Admit: 2018-12-28 | Discharge: 2018-12-28 | Primary: Family

## 2019-01-15 ENCOUNTER — Encounter: Admit: 2019-01-15 | Discharge: 2019-01-15 | Payer: BC Managed Care – PPO | Primary: Family

## 2019-08-12 ENCOUNTER — Encounter: Admit: 2019-08-12 | Discharge: 2019-08-12 | Payer: BC Managed Care – PPO | Primary: Family

## 2019-08-12 NOTE — Progress Notes
CT Chest dated 03/15/2019 received from from outside provider. This was completed from the recommendations made on CT Chest dated 06/20/2017. Results reviewed and scanned into chart. Closed loop imaging completed.

## 2022-08-04 ENCOUNTER — Encounter: Admit: 2022-08-04 | Discharge: 2022-08-04 | Payer: BC Managed Care – PPO | Primary: Family

## 2022-08-05 ENCOUNTER — Encounter: Admit: 2022-08-05 | Discharge: 2022-08-05 | Payer: BC Managed Care – PPO | Primary: Family

## 2023-04-24 ENCOUNTER — Encounter: Admit: 2023-04-24 | Discharge: 2023-04-24 | Payer: MEDICARE | Primary: Family

## 2023-06-23 ENCOUNTER — Encounter: Admit: 2023-06-23 | Discharge: 2023-06-23 | Payer: MEDICARE | Primary: Family

## 2023-07-18 ENCOUNTER — Encounter: Admit: 2023-07-18 | Discharge: 2023-07-18 | Payer: MEDICARE | Primary: Family

## 2023-07-24 ENCOUNTER — Ambulatory Visit: Admit: 2023-07-24 | Discharge: 2023-07-24 | Payer: MEDICARE | Primary: Family

## 2023-07-25 ENCOUNTER — Ambulatory Visit: Admit: 2023-07-25 | Discharge: 2023-07-26 | Payer: MEDICARE | Primary: Family

## 2023-07-25 ENCOUNTER — Encounter: Admit: 2023-07-25 | Discharge: 2023-07-25 | Payer: MEDICARE | Primary: Family

## 2023-07-25 ENCOUNTER — Ambulatory Visit: Admit: 2023-07-25 | Discharge: 2023-07-25 | Payer: MEDICARE | Primary: Family

## 2023-07-25 MED ORDER — LIDOCAINE (PF) 20 MG/ML (2 %) IJ SOLN
INTRAVENOUS | 0 refills | Status: DC
Start: 2023-07-25 — End: 2023-07-25

## 2023-07-25 MED ORDER — KETAMINE 10 MG/ML IJ SOLN
INTRAVENOUS | 0 refills | Status: DC
Start: 2023-07-25 — End: 2023-07-25

## 2023-07-25 MED ORDER — PHENYLEPHRINE HCL IN 0.9% NACL 1 MG/10 ML (100 MCG/ML) IV SYRG
INTRAVENOUS | 0 refills | Status: DC
Start: 2023-07-25 — End: 2023-07-25

## 2023-07-25 MED ORDER — PROPOFOL 10 MG/ML IV EMUL 50 ML (INFUSION)(AM)(OR)
INTRAVENOUS | 0 refills | Status: DC
Start: 2023-07-25 — End: 2023-07-25

## 2023-07-25 MED ORDER — DEXMEDETOMIDINE IN 0.9 % NACL 20 MCG/5 ML (4 MCG/ML) IV SYRG
INTRAVENOUS | 0 refills | Status: DC
Start: 2023-07-25 — End: 2023-07-25

## 2023-07-26 ENCOUNTER — Encounter: Admit: 2023-07-26 | Discharge: 2023-07-26 | Payer: MEDICARE | Primary: Family
